# Patient Record
Sex: Male | Born: 2005 | Race: Black or African American | Hispanic: No | Marital: Single | Smoking: Never smoker
Health system: Southern US, Community
[De-identification: ages and names within clinical notes are randomized; demographics above are authoritative.]

## PROBLEM LIST (undated history)

## (undated) DIAGNOSIS — F3481 Disruptive mood dysregulation disorder: Secondary | ICD-10-CM

## (undated) DIAGNOSIS — F909 Attention-deficit hyperactivity disorder, unspecified type: Secondary | ICD-10-CM

---
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
## 2025-03-16 ENCOUNTER — Ambulatory Visit (HOSPITAL_BASED_OUTPATIENT_CLINIC_OR_DEPARTMENT_OTHER): Payer: MEDICAID | Admitting: Family Medicine
